# Patient Record
Sex: Female | Born: 1995 | Race: White | Marital: Single | Smoking: Never smoker
Health system: Southern US, Community
[De-identification: ages and names within clinical notes are randomized; demographics above are authoritative.]

---
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
## 2015-12-02 ENCOUNTER — Encounter: Payer: Self-pay | Admitting: Obstetrics and Gynecology

## 2016-10-19 ENCOUNTER — Other Ambulatory Visit (INDEPENDENT_AMBULATORY_CARE_PROVIDER_SITE_OTHER): Payer: Self-pay | Admitting: Otolaryngology

## 2016-10-19 DIAGNOSIS — J320 Chronic maxillary sinusitis: Secondary | ICD-10-CM

## 2016-11-11 ENCOUNTER — Ambulatory Visit
Admission: RE | Admit: 2016-11-11 | Discharge: 2016-11-11 | Disposition: A | Discharge status: Home / Self Care | Payer: 59 | Source: Ambulatory Visit | Attending: Otolaryngology | Admitting: Otolaryngology

## 2016-11-11 ENCOUNTER — Other Ambulatory Visit: Payer: Self-pay

## 2016-11-11 DIAGNOSIS — J320 Chronic maxillary sinusitis: Secondary | ICD-10-CM

## 2017-09-17 ENCOUNTER — Emergency Department (HOSPITAL_COMMUNITY)
Admission: EM | Admit: 2017-09-17 | Discharge: 2017-09-17 | Disposition: A | Discharge status: Home / Self Care | Payer: 59 | Attending: Emergency Medicine | Admitting: Emergency Medicine

## 2017-09-17 ENCOUNTER — Encounter (HOSPITAL_COMMUNITY): Payer: Self-pay | Admitting: Nurse Practitioner

## 2017-09-17 DIAGNOSIS — H6691 Otitis media, unspecified, right ear: Secondary | ICD-10-CM | POA: Insufficient documentation

## 2017-09-17 DIAGNOSIS — H6690 Otitis media, unspecified, unspecified ear: Secondary | ICD-10-CM

## 2017-09-17 DIAGNOSIS — Z79899 Other long term (current) drug therapy: Secondary | ICD-10-CM | POA: Insufficient documentation

## 2017-09-17 DIAGNOSIS — H9201 Otalgia, right ear: Secondary | ICD-10-CM | POA: Diagnosis present

## 2017-09-17 MED ORDER — AMOXICILLIN 500 MG PO CAPS
500.0000 mg | ORAL_CAPSULE | Freq: Once | ORAL | Status: AC
  Administered 2017-09-17: 500 mg via ORAL
  Filled 2017-09-17: qty 1

## 2017-09-17 MED ORDER — AMOXICILLIN 500 MG PO CAPS: 500.0000 mg | ORAL_CAPSULE | Freq: Three times a day (TID) | ORAL | 0 refills | Status: DC

## 2017-09-17 NOTE — ED Provider Notes (Signed)
[facility] Provider Note   CSN: [id number] Arrival date & time: 09/17/17  0105     History   Chief Complaint Chief Complaint  Patient presents with  . Otalgia    Right     HPI Joya GaskinsCatherine Galas is a 21 y.o. female.  Patient presents to the emergency department with a chief complaint of earache.  She states that she has had sinus congestion and rhinorrhea for the past week.  She reports that she began having sudden onset right ear pain tonight.  She also reports some left ear pain.  She endorses some decreased hearing in the right ear.  She denies any fevers or chills.  Denies any other associated symptoms.  Denies any modifying factors.   The history is provided by the patient. No language interpreter was used.    History reviewed. No pertinent past medical history.  There are no active problems to display for this patient.   History reviewed. No pertinent surgical history.  OB History    No data available       Home Medications    Prior to Admission medications   Medication Sig Start Date End Date Taking? Authorizing Provider  amoxicillin (AMOXIL) 500 MG capsule Take 1 capsule (500 mg total) by mouth 3 (three) times daily. 09/17/17   Roxy HorsemanBrowning, Cass Edinger, PA-C  cephALEXin (KEFLEX) 500 MG capsule Take 500 mg by mouth 2 (two) times daily.    [provider]  HYDROcodone-acetaminophen (NORCO/VICODIN) 5-325 MG per tablet Take 1-2 tablets by mouth every 6 (six) hours as needed for pain. 06/01/13   Cathren LaineSteinl, Kevin, MD  loratadine (CLARITIN) 10 MG tablet Take 10 mg by mouth daily as needed for allergies.    [provider]  Norgestimate-Ethinyl Estradiol Triphasic (ORTHO TRI-CYCLEN, 28,) 0.18/0.215/0.25 MG-35 MCG tablet Take 1 tablet by mouth every evening.    [provider]    Family History No family history on file.  Social History Social History  Substance Use Topics  . Smoking status: Never Smoker  .  Smokeless tobacco: Never Used  . Alcohol use Yes     Comment: socially      Allergies   Patient has no known allergies.   Review of Systems Review of Systems  All other systems reviewed and are negative.    Physical Exam Updated Vital Signs BP 132/84 (BP Location: Left Arm)   Pulse 89   Temp 97.9 F (36.6 C) (Oral)   Resp 18   Ht 5\' 4"  (1.626 m)   Wt 90.7 kg (200 lb)   SpO2 99%   BMI 34.33 kg/m   Physical Exam  Constitutional: She is oriented to person, place, and time. She appears well-developed and well-nourished.  HENT:  Head: Normocephalic and atraumatic.  Right TM is erythematous with congestion, normal canal Left TM is erythematous without congestion, normal canal  Eyes: Conjunctivae and EOM are normal.  Neck: Normal range of motion.  Cardiovascular: Normal rate, regular rhythm and normal heart sounds.   Pulmonary/Chest: Breath sounds normal. No respiratory distress. She has no wheezes.  Abdominal: She exhibits no distension.  Musculoskeletal: Normal range of motion.  Neurological: She is alert and oriented to person, place, and time.  Skin: Skin is dry.  Psychiatric: She has a normal mood and affect. Her behavior is normal. Judgment and thought content normal.  Nursing note and vitals reviewed.    ED Treatments / Results  Labs (all labs ordered are listed, but only abnormal results are  displayed) Labs Reviewed - No data to display  EKG  EKG Interpretation None       Radiology No results found.  Procedures Procedures (including critical care time)  Medications Ordered in ED Medications  amoxicillin (AMOXIL) capsule 500 mg (not administered)     Initial Impression / Assessment and Plan / ED Course  I have reviewed the triage vital signs and the nursing notes.  Pertinent labs & imaging results that were available during my care of the patient were reviewed by me and considered in my medical decision making (see chart for details).      This patient with otitis media.  Will treat with amoxicillin.  PCP follow-up.  Final Clinical Impressions(s) / ED Diagnoses   Final diagnoses:  Acute otitis media, unspecified otitis media type    New Prescriptions New Prescriptions   AMOXICILLIN (AMOXIL) 500 MG CAPSULE    Take 1 capsule (500 mg total) by mouth 3 (three) times daily.     Roxy Horseman, PA-C 09/17/17 0158    Paula Libra, MD 09/17/17 [phone]

## 2017-09-17 NOTE — ED Triage Notes (Signed)
Patient reports she woke up this morning with severe right ear pain. Denies any recent injuries or objects placed in the ear. Reports taking ibuprofen around midnight and attempted to lay back down but the pain became more severe and felt like someone was stabbing in her ear. Patient is ambulatory and can talk in complete sentences. Denies any fever or redness to ear site.

## 2018-06-05 IMAGING — CT CT MAXILLOFACIAL W/O CM
3 series · 15 of 47 positions shown, 18 images · non-contrast
Comparison: None.

CLINICAL DATA: Chronic maxillary sinusitis.

EXAM:
CT MAXILLOFACIAL WITHOUT CONTRAST
TECHNIQUE: Multidetector CT imaging of the maxillofacial structures was
performed. Multiplanar CT image reconstructions were also generated.
A small metallic BB was placed on the right temple in order to
reliably differentiate right from left.

[Series 3: soft tissue · axial · 0.48mm/px · z∈[-371,-219]mm · 9 of 178 slices shown, 12 images]
[im 13/178  brain]
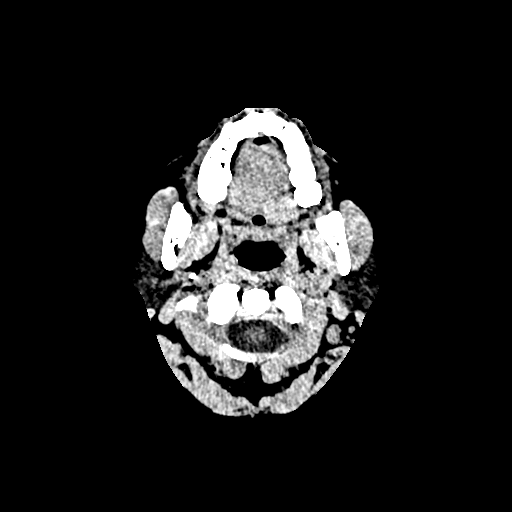
[im 13/178  bone]
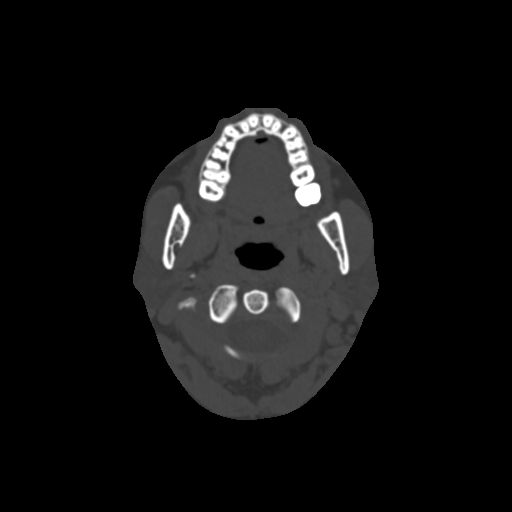
[im 31/178  bone]
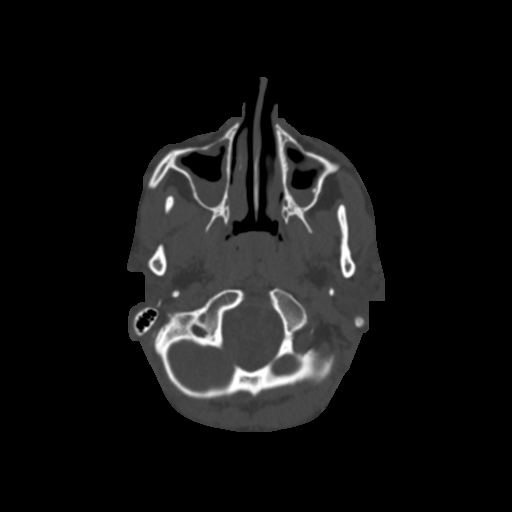
[im 49/178  bone]
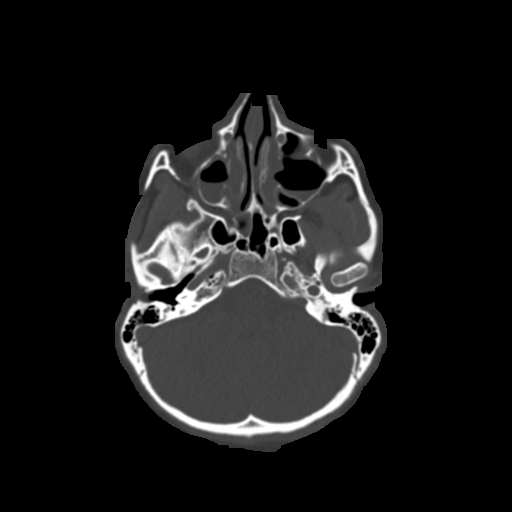
[im 68/178  bone]
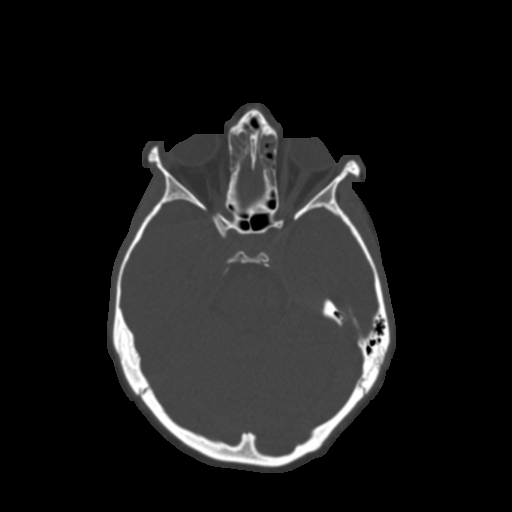
[im 92/178  brain]
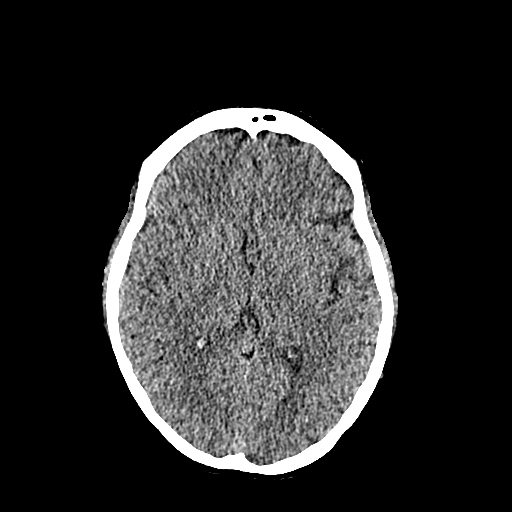
[im 92/178  bone]
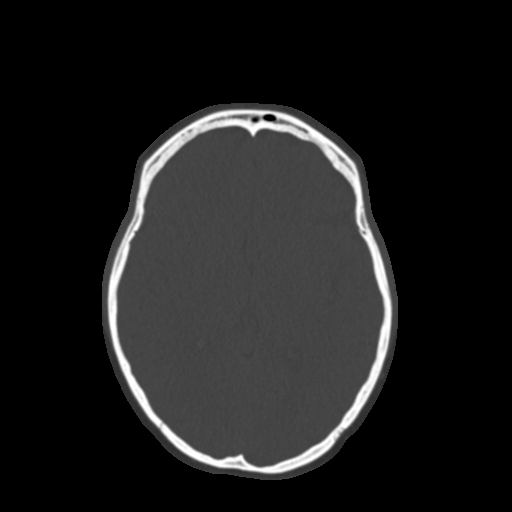
[im 110/178  bone]
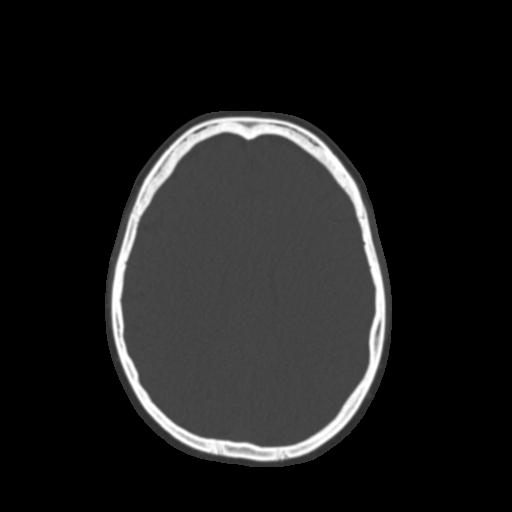
[im 129/178  bone]
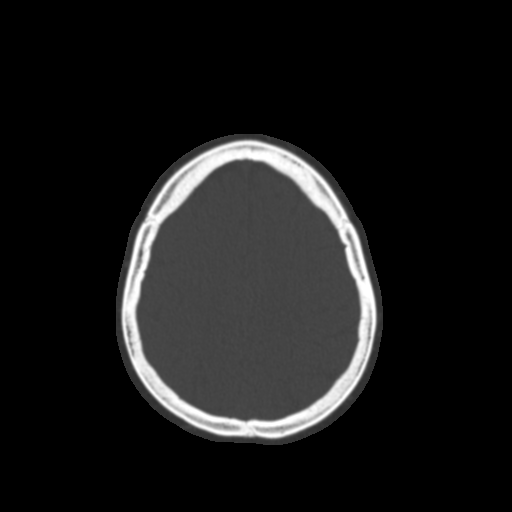
[im 147/178  bone]
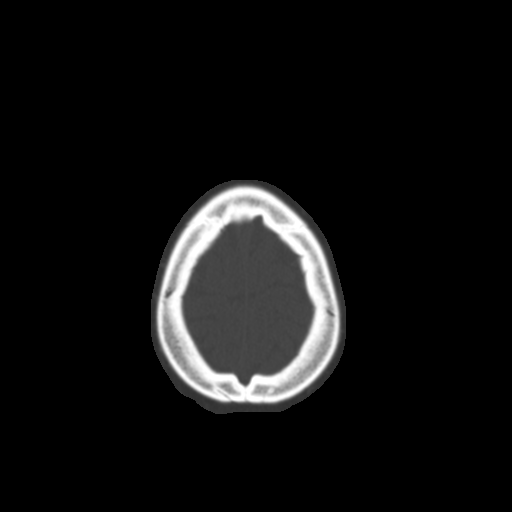
[im 165/178  brain]
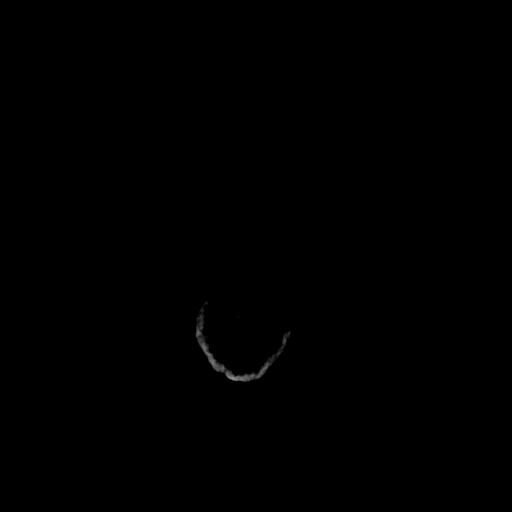
[im 165/178  bone]
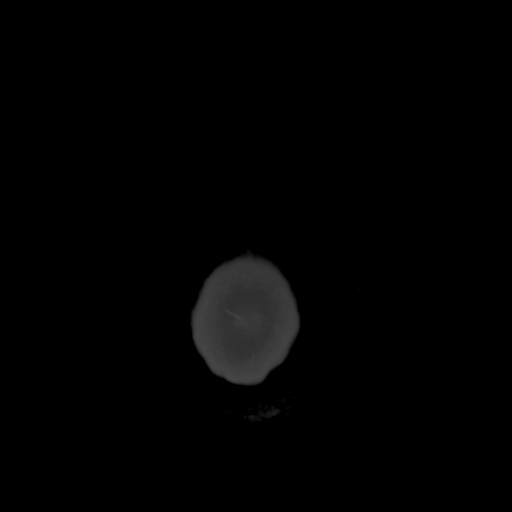

[Series 6: cor soft · coronal · 0.33mm/px · 3 of 105 slices shown]
[im 35/105  bone]
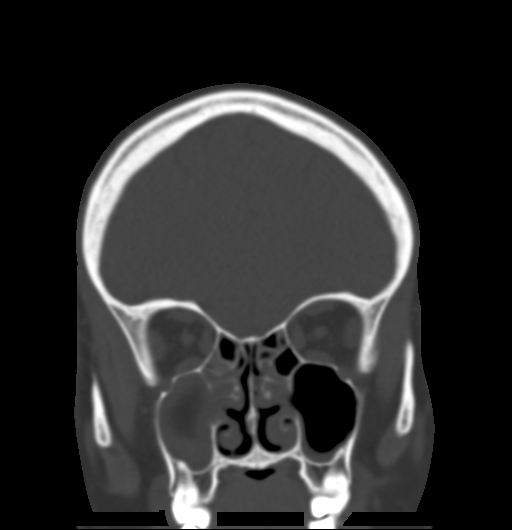
[im 47/105  bone]
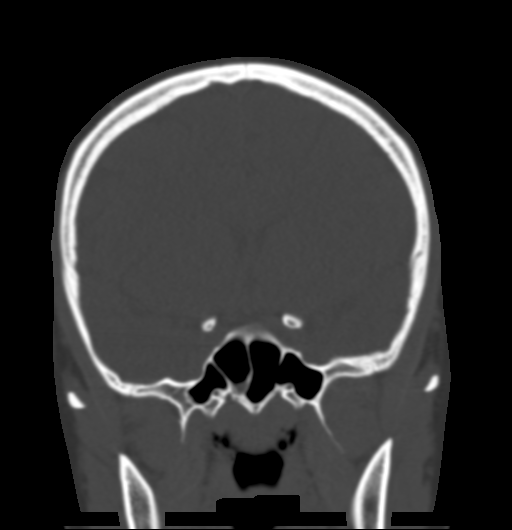
[im 58/105  bone]
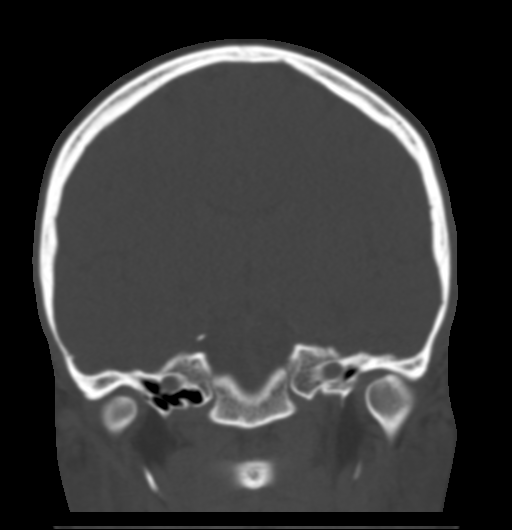

[Series 7: sag soft · sagittal · 0.33mm/px · 3 of 78 slices shown]
[im 26/78  bone]
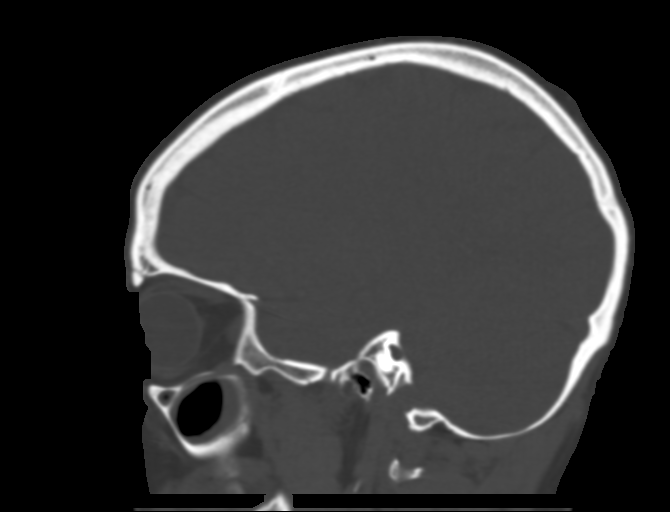
[im 39/78  bone]
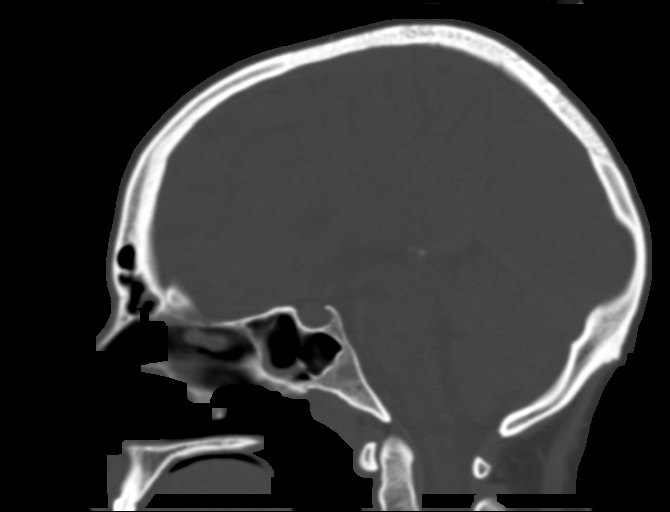
[im 52/78  bone]
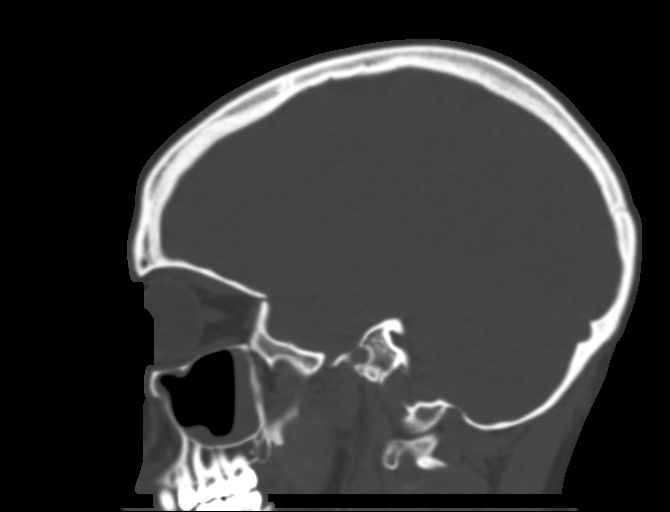

[15 of 47 positions shown; findings below may reference images not displayed]

FINDINGS: Osseous: No fracture destructive process.

Orbits: Negative

Sinuses: There is advanced diffuse mucosal thickening, effacing most
anterior ethmoid air cells.

Bilateral middle turbinates are effaced by mucosal thickening, as
are the maxillary infundibula. Fluid levels later in the bilateral
maxillary sinuses.

Mucosal thickening completely effaces bilateral frontal ethmoidal
recesses. The right frontal sinus is hypoplastic.

Both sphenoid sinus ostia are effaced by mucosal thickening, without
active sphenoid sinusitis.

Midline nasal septum with small rightward spur not deforming the
inferior turbinate. Symmetric olfactory recess depth. Intact medial
orbital walls.

Soft tissues: No soft tissue inflammation. Adenoid thickening with
nasopharyngeal narrowing.

Limited intracranial: Negative
IMPRESSION: 1. Generalized sinusitis and sinus obstruction from mucosal
thickening. Bilateral maxillary fluid levels.
2. Midline nasal septum with small spur.
3. Hypertrophic adenoid.

## 2018-08-30 ENCOUNTER — Telehealth: Payer: 59 | Admitting: Family

## 2018-08-30 DIAGNOSIS — H60339 Swimmer's ear, unspecified ear: Secondary | ICD-10-CM

## 2018-08-30 NOTE — Progress Notes (Signed)
Thank you for the details you included in the comment boxes. Those details are very helpful in determining the best course of treatment for you and help [place] to provide the best care.  E Visit for Swimmer's Ear  We are sorry that you are not feeling well. Here is how we plan to help!  I have prescribed: Ciprofloxin 0.2% and hydrocortisone 1% otic suspension 3 drops in affected ears twice daily until completed  Cipro 500mg , one tablet my mouth twice a day for 10 days  In certain cases swimmer's ear may progress to a more serious bacterial infection of the middle or inner ear.  If you have a fever 102 and up and significantly worsening symptoms, this could indicate a more serious infection moving to the middle/inner and needs face to face evaluation in an office by a provider.  Your symptoms should improve over the next 3 days and should resolve in about 7 days.  HOME CARE:   Wash your hands frequently.  Do not place the tip of the bottle on your ear or touch it with your fingers.  You can take Acetominophen 650 mg every 4-6 hours as needed for pain.  If pain is severe or moderate, you can apply a heating pad (set on low) or hot water bottle (wrapped in a towel) to outer ear for 20 minutes.  This will also increase drainage.  Avoid ear plugs  Do not use Q-tips  After showers, help the water run out by tilting your head to one side.  GET HELP RIGHT AWAY IF:   Fever is over 102.2 degrees.  You develop progressive ear pain or hearing loss.  Ear symptoms persist longer than 3 days after treatment.  MAKE SURE YOU:   Understand these instructions.  Will watch your condition.  Will get help right away if you are not doing well or get worse.  TO PREVENT SWIMMER'S EAR:  Use a bathing cap or custom fitted swim molds to keep your ears dry.  Towel off after swimming to dry your ears.  Tilt your head or pull your earlobes to allow the water to escape your ear canal.  If there is  still water in your ears, consider using a hairdryer on the lowest setting.  Thank you for choosing an e-visit. Your e-visit answers were reviewed by a board certified advanced clinical practitioner to complete your personal care plan. Depending upon the condition, your plan could have included both over the counter or prescription medications. Please review your pharmacy choice. Be sure that the pharmacy you have chosen is open so that you can pick up your prescription now.  If there is a problem you may message your provider in MyChart to have the prescription routed to another pharmacy. Your safety is important to [place]. If you have drug allergies check your prescription carefully.  For the next 24 hours, you can use MyChart to ask questions about today's visit, request a non-urgent call back, or ask for a work or school excuse from your e-visit provider. You will get an email in the next two days asking about your experience. I hope that your e-visit has been valuable and will speed your recovery.

## 2019-01-04 ENCOUNTER — Telehealth: Payer: 59 | Admitting: Family

## 2019-01-04 DIAGNOSIS — J019 Acute sinusitis, unspecified: Secondary | ICD-10-CM | POA: Diagnosis not present

## 2019-01-04 DIAGNOSIS — H65193 Other acute nonsuppurative otitis media, bilateral: Secondary | ICD-10-CM

## 2019-01-04 MED ORDER — AMOXICILLIN-POT CLAVULANATE 875-125 MG PO TABS: 1.0000 | ORAL_TABLET | Freq: Two times a day (BID) | ORAL | 0 refills | Status: DC

## 2019-01-04 NOTE — Progress Notes (Signed)
We are sorry that you are not feeling well.  Here is how we plan to help!  Based on what you have shared with me it looks like you have sinusitis.  Sinusitis is inflammation and infection in the sinus cavities of the head.  Based on your presentation I believe you most likely have Acute Bacterial Sinusitis.  This is an infection caused by bacteria and is treated with antibiotics. I have prescribed Augmentin 875mg /125mg  one tablet twice daily with food, for 7 days. You may use an oral decongestant such as Mucinex D or if you have glaucoma or high blood pressure use plain Mucinex. Saline nasal spray help and can safely be used as often as needed for congestion.  If you develop worsening sinus pain, fever or notice severe headache and vision changes, or if symptoms are not better after completion of antibiotic, please schedule an appointment with a health care provider.    Sinus infections are not as easily transmitted as other respiratory infection, however we still recommend that you avoid close contact with loved ones, especially the very young and elderly.  Remember to wash your hands thoroughly throughout the day as this is the number one way to prevent the spread of infection!  Approx 5 mins was spent reviewing patient's medical record and documenting.   Home Care:  Only take medications as instructed by your medical team.  Complete the entire course of an antibiotic.  Do not take these medications with alcohol.  A steam or ultrasonic humidifier can help congestion.  You can place a towel over your head and breathe in the steam from hot water coming from a faucet.  Avoid close contacts especially the very young and the elderly.  Cover your mouth when you cough or sneeze.  Always remember to wash your hands.  Get Help Right Away If:  You develop worsening fever or sinus pain.  You develop a severe head ache or visual changes.  Your symptoms persist after you have completed your  treatment plan.  Make sure you  Understand these instructions.  Will watch your condition.  Will get help right away if you are not doing well or get worse.  Your e-visit answers were reviewed by a board certified advanced clinical practitioner to complete your personal care plan.  Depending on the condition, your plan could have included both over the counter or prescription medications.  If there is a problem please reply  once you have received a response from your provider.  Your safety is important to [place].  If you have drug allergies check your prescription carefully.    You can use MyChart to ask questions about today's visit, request a non-urgent call back, or ask for a work or school excuse for 24 hours related to this e-Visit. If it has been greater than 24 hours you will need to follow up with your provider, or enter a new e-Visit to address those concerns.  You will get an e-mail in the next two days asking about your experience.  I hope that your e-visit has been valuable and will speed your recovery. Thank you for using e-visits.

## 2019-01-21 ENCOUNTER — Telehealth: Payer: 59 | Admitting: Family

## 2019-01-21 DIAGNOSIS — J019 Acute sinusitis, unspecified: Secondary | ICD-10-CM | POA: Diagnosis not present

## 2019-01-21 DIAGNOSIS — B9789 Other viral agents as the cause of diseases classified elsewhere: Secondary | ICD-10-CM | POA: Diagnosis not present

## 2019-01-21 MED ORDER — FLUTICASONE PROPIONATE 50 MCG/ACT NA SUSP: 2.0000 | Freq: Every day | NASAL | 6 refills | Status: AC

## 2019-01-21 NOTE — Progress Notes (Signed)
We are sorry that you are not feeling well.  Here is how we plan to help!  Based on what you have shared with me it looks like you have sinusitis.  Sinusitis is inflammation and infection in the sinus cavities of the head.  Based on your presentation I believe you most likely have Acute Viral Sinusitis.This is an infection most likely caused by a virus. There is not specific treatment for viral sinusitis other than to help you with the symptoms until the infection runs its course.  You may use an oral decongestant such as Mucinex D or if you have glaucoma or high blood pressure use plain Mucinex. Saline nasal spray help and can safely be used as often as needed for congestion, I have prescribed: Fluticasone nasal spray two sprays in each nostril once a day. If you continue to have problems, a face to face visit is recommended considering you had a bacterial sinusitis treated 3 weeks ago.  Some authorities believe that zinc sprays or the use of Echinacea may shorten the course of your symptoms.  Sinus infections are not as easily transmitted as other respiratory infection, however we still recommend that you avoid close contact with loved ones, especially the very young and elderly.  Remember to wash your hands thoroughly throughout the day as this is the number one way to prevent the spread of infection!  Home Care:  Only take medications as instructed by your medical team.  Do not take these medications with alcohol.  A steam or ultrasonic humidifier can help congestion.  You can place a towel over your head and breathe in the steam from hot water coming from a faucet.  Avoid close contacts especially the very young and the elderly.  Cover your mouth when you cough or sneeze.  Always remember to wash your hands.  Get Help Right Away If:  You develop worsening fever or sinus pain.  You develop a severe head ache or visual changes.  Your symptoms persist after you have completed your  treatment plan.  Make sure you  Understand these instructions.  Will watch your condition.  Will get help right away if you are not doing well or get worse.  Your e-visit answers were reviewed by a board certified advanced clinical practitioner to complete your personal care plan.  Depending on the condition, your plan could have included both over the counter or prescription medications.  If there is a problem please reply  once you have received a response from your provider.  Your safety is important to [place].  If you have drug allergies check your prescription carefully.    You can use MyChart to ask questions about today's visit, request a non-urgent call back, or ask for a work or school excuse for 24 hours related to this e-Visit. If it has been greater than 24 hours you will need to follow up with your provider, or enter a new e-Visit to address those concerns.  You will get an e-mail in the next two days asking about your experience.  I hope that your e-visit has been valuable and will speed your recovery. Thank you for using e-visits.

## 2019-03-30 NOTE — Progress Notes (Signed)
Greater than 5 minutes, yet less than 10 minutes of time have been spent researching, coordinating, and implementing care for this patient today.  Thank you for the details you included in the comment boxes. Those details are very helpful in determining the best course of treatment for you and help us to provide the best care.  

## 2019-07-04 ENCOUNTER — Telehealth: Payer: 59 | Admitting: Family

## 2019-07-04 DIAGNOSIS — B9689 Other specified bacterial agents as the cause of diseases classified elsewhere: Secondary | ICD-10-CM | POA: Diagnosis not present

## 2019-07-04 DIAGNOSIS — J019 Acute sinusitis, unspecified: Secondary | ICD-10-CM | POA: Diagnosis not present

## 2019-07-04 MED ORDER — AMOXICILLIN-POT CLAVULANATE 875-125 MG PO TABS: 1.0000 | ORAL_TABLET | Freq: Two times a day (BID) | ORAL | 0 refills | Status: DC

## 2019-07-04 NOTE — Progress Notes (Signed)

## 2019-11-04 ENCOUNTER — Telehealth: Payer: 59 | Admitting: Physician Assistant

## 2019-11-04 DIAGNOSIS — H60501 Unspecified acute noninfective otitis externa, right ear: Secondary | ICD-10-CM

## 2019-11-04 MED ORDER — CIPRO HC 0.2-1 % OT SUSP: 3.0000 [drp] | Freq: Two times a day (BID) | OTIC | 0 refills | Status: AC

## 2019-11-04 NOTE — Progress Notes (Signed)
E Visit for Swimmer's Ear  We are sorry that you are not feeling well. Here is how we plan to help!  I have prescribed: Ciprofloxin 0.2% and hydrocortisone 1% otic suspension 3 drops in affected ears twice daily for 7 days   In certain cases swimmer's ear may progress to a more serious bacterial infection of the middle or inner ear.  If you have a fever 102 and up and significantly worsening symptoms, this could indicate a more serious infection moving to the middle/inner and needs face to face evaluation in an office by a provider.  Your symptoms should improve over the next 3 days and should resolve in about 7 days.   HOME CARE:   Wash your hands frequently.  Do not place the tip of the bottle on your ear or touch it with your fingers.  You can take Acetominophen 650 mg every 4-6 hours as needed for pain.  If pain is severe or moderate, you can apply a heating pad (set on low) or hot water bottle (wrapped in a towel) to outer ear for 20 minutes.  This will also increase drainage.  Avoid ear plugs  Do not use Q-tips  After showers, help the water run out by tilting your head to one side.  GET HELP RIGHT AWAY IF:   Fever is over 102.2 degrees.  You develop progressive ear pain or hearing loss.  Ear symptoms persist longer than 3 days after treatment.  MAKE SURE YOU:   Understand these instructions.  Will watch your condition.  Will get help right away if you are not doing well or get worse.  TO PREVENT SWIMMER'S EAR:  Use a bathing cap or custom fitted swim molds to keep your ears dry.  Towel off after swimming to dry your ears.  Tilt your head or pull your earlobes to allow the water to escape your ear canal.  If there is still water in your ears, consider using a hairdryer on the lowest setting.  Thank you for choosing an e-visit. Your e-visit answers were reviewed by a board certified advanced clinical practitioner to complete your personal care plan.  Depending upon the condition, your plan could have included both over the counter or prescription medications. Please review your pharmacy choice. Be sure that the pharmacy you have chosen is open so that you can pick up your prescription now.  If there is a problem you may message your provider in [place] to have the prescription routed to another pharmacy. Your safety is important to [place]. If you have drug allergies check your prescription carefully.  For the next 24 hours, you can use MyChart to ask questions about today's visit, request a non-urgent call back, or ask for a work or school excuse from your e-visit provider. You will get an email in the next two days asking about your experience. I hope that your e-visit has been valuable and will speed your recovery.    Greater than 5 minutes, yet less than 10 minutes of time have been spent researching, coordinating and implementing care for this patient today.

## 2020-05-14 ENCOUNTER — Telehealth: Payer: 59 | Admitting: Physician Assistant

## 2020-05-14 DIAGNOSIS — J029 Acute pharyngitis, unspecified: Secondary | ICD-10-CM | POA: Diagnosis not present

## 2020-05-14 MED ORDER — AMOXICILLIN 500 MG PO CAPS: 500.0000 mg | ORAL_CAPSULE | Freq: Two times a day (BID) | ORAL | 0 refills | Status: AC

## 2020-05-14 NOTE — Progress Notes (Signed)

## 2020-08-01 ENCOUNTER — Telehealth: Payer: 59 | Admitting: Nurse Practitioner

## 2020-08-01 DIAGNOSIS — B9689 Other specified bacterial agents as the cause of diseases classified elsewhere: Secondary | ICD-10-CM | POA: Diagnosis not present

## 2020-08-01 DIAGNOSIS — N760 Acute vaginitis: Secondary | ICD-10-CM | POA: Diagnosis not present

## 2020-08-01 MED ORDER — METRONIDAZOLE 500 MG PO TABS: 500.0000 mg | ORAL_TABLET | Freq: Two times a day (BID) | ORAL | 0 refills | Status: DC

## 2020-08-01 NOTE — Progress Notes (Signed)

## 2020-08-26 ENCOUNTER — Telehealth: Payer: 59 | Admitting: Family

## 2020-08-26 DIAGNOSIS — J029 Acute pharyngitis, unspecified: Secondary | ICD-10-CM | POA: Diagnosis not present

## 2020-08-26 MED ORDER — AMOXICILLIN 500 MG PO CAPS: 500.0000 mg | ORAL_CAPSULE | Freq: Two times a day (BID) | ORAL | 0 refills | Status: AC

## 2020-08-26 NOTE — Progress Notes (Signed)

## 2020-11-04 ENCOUNTER — Telehealth: Payer: 59 | Admitting: Nurse Practitioner

## 2020-11-04 DIAGNOSIS — H6502 Acute serous otitis media, left ear: Secondary | ICD-10-CM

## 2020-11-04 MED ORDER — AMOXICILLIN-POT CLAVULANATE 875-125 MG PO TABS: 1.0000 | ORAL_TABLET | Freq: Two times a day (BID) | ORAL | 0 refills | Status: DC

## 2020-11-04 MED ORDER — NEOMYCIN-POLYMYXIN-HC 3.5-10000-1 OT SOLN: 4.0000 [drp] | Freq: Four times a day (QID) | OTIC | 0 refills | Status: DC

## 2020-11-04 NOTE — Progress Notes (Signed)
E Visit for Swimmer's Ear  We are sorry that you are not feeling well. Here is how we plan to help!  I have prescribed: Neomycin 0.35%, polymyxin B 10,000 units/mL, and hydrocortisone 0,5% otic solution 4 drops in affected ears four times a day for 7 days  Augmentin 625mg  one tablet by mouth twice a day for 10 days  In certain cases swimmer's ear may progress to a more serious bacterial infection of the middle or inner ear.  If you have a fever 102 and up and significantly worsening symptoms, this could indicate a more serious infection moving to the middle/inner and needs face to face evaluation in an office by a provider.  Your symptoms should improve over the next 3 days and should resolve in about 7 days.  HOME CARE:   Wash your hands frequently.  Do not place the tip of the bottle on your ear or touch it with your fingers.  You can take Acetominophen 650 mg every 4-6 hours as needed for pain.  If pain is severe or moderate, you can apply a heating pad (set on low) or hot water bottle (wrapped in a towel) to outer ear for 20 minutes.  This will also increase drainage.  Avoid ear plugs  Do not use Q-tips  After showers, help the water run out by tilting your head to one side.  GET HELP RIGHT AWAY IF:   Fever is over 102.2 degrees.  You develop progressive ear pain or hearing loss.  Ear symptoms persist longer than 3 days after treatment.  MAKE SURE YOU:   Understand these instructions.  Will watch your condition.  Will get help right away if you are not doing well or get worse.  TO PREVENT SWIMMER'S EAR:  Use a bathing cap or custom fitted swim molds to keep your ears dry.  Towel off after swimming to dry your ears.  Tilt your head or pull your earlobes to allow the water to escape your ear canal.  If there is still water in your ears, consider using a hairdryer on the lowest setting.  Thank you for choosing an e-visit. Your e-visit answers were reviewed by  a board certified advanced clinical practitioner to complete your personal care plan. Depending upon the condition, your plan could have included both over the counter or prescription medications. Please review your pharmacy choice. Be sure that the pharmacy you have chosen is open so that you can pick up your prescription now.  If there is a problem you may message your provider in MyChart to have the prescription routed to another pharmacy. Your safety is important to . If you have drug allergies check your prescription carefully.  For the next 24 hours, you can use MyChart to ask questions about today's visit, request a non-urgent call back, or ask for a work or school excuse from your e-visit provider. You will get an email in the next two days asking about your experience. I hope that your e-visit has been valuable and will speed your recovery.

## 2020-11-05 NOTE — Progress Notes (Signed)
5-10 minutes spent reviewing and documenting in chart.  

## 2021-04-14 ENCOUNTER — Telehealth: Payer: 59 | Admitting: Family

## 2021-04-14 DIAGNOSIS — J029 Acute pharyngitis, unspecified: Secondary | ICD-10-CM | POA: Diagnosis not present

## 2021-04-14 MED ORDER — AMOXICILLIN 500 MG PO CAPS: 500.0000 mg | ORAL_CAPSULE | Freq: Two times a day (BID) | ORAL | 0 refills | Status: AC

## 2021-04-14 NOTE — Progress Notes (Signed)

## 2021-08-02 ENCOUNTER — Telehealth: Payer: Self-pay | Admitting: Physician Assistant

## 2021-08-02 DIAGNOSIS — H109 Unspecified conjunctivitis: Secondary | ICD-10-CM

## 2021-08-02 MED ORDER — POLYMYXIN B-TRIMETHOPRIM 10000-0.1 UNIT/ML-% OP SOLN
1.0000 [drp] | OPHTHALMIC | 0 refills | Status: DC
Start: 2021-08-02 — End: 2024-08-23

## 2021-08-02 NOTE — Progress Notes (Signed)

## 2021-10-08 ENCOUNTER — Telehealth: Payer: Self-pay | Admitting: Physician Assistant

## 2021-10-08 DIAGNOSIS — J020 Streptococcal pharyngitis: Secondary | ICD-10-CM

## 2021-10-08 MED ORDER — AMOXICILLIN 500 MG PO CAPS: 500.0000 mg | ORAL_CAPSULE | Freq: Two times a day (BID) | ORAL | 0 refills | Status: AC

## 2021-10-08 NOTE — Progress Notes (Signed)

## 2022-02-25 ENCOUNTER — Telehealth: Payer: Self-pay | Admitting: Emergency Medicine

## 2022-02-25 DIAGNOSIS — J029 Acute pharyngitis, unspecified: Secondary | ICD-10-CM

## 2022-02-25 MED ORDER — AMOXICILLIN 500 MG PO CAPS: 500.0000 mg | ORAL_CAPSULE | Freq: Two times a day (BID) | ORAL | 0 refills | Status: AC

## 2022-02-25 NOTE — Progress Notes (Signed)
E-Visit for Sore Throat - Strep Symptoms ? ?We are sorry that you are not feeling well.  Here is how we plan to help! ? ?Based on what you have shared with me it is likely that you have strep pharyngitis.  Strep pharyngitis is inflammation and infection in the back of the throat.  This is an infection cause by bacteria and is treated with antibiotics.  I have prescribed Amoxicillin 500 mg twice a day for 10 days. For throat pain, we recommend over the counter oral pain relief medications such as acetaminophen or aspirin, or anti-inflammatory medications such as ibuprofen or naproxen sodium. Topical treatments such as oral throat lozenges or sprays may be used as needed. Strep infections are not as easily transmitted as other respiratory infections, however we still recommend that you avoid close contact with loved ones, especially the very young and elderly.  Remember to wash your hands thoroughly throughout the day as this is the number one way to prevent the spread of infection and wipe down door knobs and counters with disinfectant. ? ?Since you also have drainage, please keep taking Mucinex.  ? ?Home Care: ?Only take medications as instructed by your medical team. ?Complete the entire course of an antibiotic. ?Do not take these medications with alcohol. ?A steam or ultrasonic humidifier can help congestion.  You can place a towel over your head and breathe in the steam from hot water coming from a faucet. ?Avoid close contacts especially the very young and the elderly. ?Cover your mouth when you cough or sneeze. ?Always remember to wash your hands. ? ?Get Help Right Away If: ?You develop worsening fever or sinus pain. ?You develop a severe head ache or visual changes. ?Your symptoms persist after you have completed your treatment plan. ? ?Make sure you ?Understand these instructions. ?Will watch your condition. ?Will get help right away if you are not doing well or get worse. ? ? ?Thank you for choosing an  e-visit. ? ?Your e-visit answers were reviewed by a board certified advanced clinical practitioner to complete your personal care plan. Depending upon the condition, your plan could have included both over the counter or prescription medications. ? ?Please review your pharmacy choice. Make sure the pharmacy is open so you can pick up prescription now. If there is a problem, you may contact your provider through [employer] and have the prescription routed to another pharmacy.  Your safety is important to [place]. If you have drug allergies check your prescription carefully.  ? ?For the next 24 hours you can use MyChart to ask questions about today's visit, request a non-urgent call back, or ask for a work or school excuse. ?You will get an email in the next two days asking about your experience. I hope that your e-visit has been valuable and will speed your recovery.. ? ?I have spent 5 minutes in review of e-visit questionnaire, review and updating patient chart, medical decision making and response to patient.  ? ?Rica Mast, PhD, FNP-BC ?  ?

## 2022-04-05 ENCOUNTER — Telehealth: Payer: Self-pay | Admitting: Physician Assistant

## 2022-04-05 DIAGNOSIS — J0110 Acute frontal sinusitis, unspecified: Secondary | ICD-10-CM

## 2022-04-05 MED ORDER — AZITHROMYCIN 250 MG PO TABS: ORAL_TABLET | ORAL | 0 refills | Status: AC

## 2022-04-05 NOTE — Progress Notes (Signed)
E-Visit for Sinus Problems ? ?We are sorry that you are not feeling well.  Here is how we plan to help! ? ?Based on what you have shared with me it looks like you have sinusitis.  Sinusitis is inflammation and infection in the sinus cavities of the head. This very well could be a viral sinusitis so follow the supportive measures listed below. Giving your upcoming trip I will go ahead and sent in an antibiotic. I have prescribed Azithromycin to take as directed.  You may use an oral decongestant such as Mucinex D or if you have glaucoma or high blood pressure use plain Mucinex. Saline nasal spray help and can safely be used as often as needed for congestion.  If you develop worsening sinus pain, fever or notice severe headache and vision changes, or if symptoms are not better after completion of antibiotic, please schedule an appointment with a health care provider.   ? ?Sinus infections are not as easily transmitted as other respiratory infection, however we still recommend that you avoid close contact with loved ones, especially the very young and elderly.  Remember to wash your hands thoroughly throughout the day as this is the number one way to prevent the spread of infection! ? ?Home Care: ?Only take medications as instructed by your medical team. ?Complete the entire course of an antibiotic. ?Do not take these medications with alcohol. ?A steam or ultrasonic humidifier can help congestion.  You can place a towel over your head and breathe in the steam from hot water coming from a faucet. ?Avoid close contacts especially the very young and the elderly. ?Cover your mouth when you cough or sneeze. ?Always remember to wash your hands. ? ?Get Help Right Away If: ?You develop worsening fever or sinus pain. ?You develop a severe head ache or visual changes. ?Your symptoms persist after you have completed your treatment plan. ? ?Make sure you ?Understand these instructions. ?Will watch your condition. ?Will get help  right away if you are not doing well or get worse. ? ?Thank you for choosing an e-visit. ? ?Your e-visit answers were reviewed by a board certified advanced clinical practitioner to complete your personal care plan. Depending upon the condition, your plan could have included both over the counter or prescription medications. ? ?Please review your pharmacy choice. Make sure the pharmacy is open so you can pick up prescription now. If there is a problem, you may contact your provider through [employer] and have the prescription routed to another pharmacy.  Your safety is important to [place]. If you have drug allergies check your prescription carefully.  ? ?For the next 24 hours you can use MyChart to ask questions about today's visit, request a non-urgent call back, or ask for a work or school excuse. ?You will get an email in the next two days asking about your experience. I hope that your e-visit has been valuable and will speed your recovery. ? ?

## 2022-04-05 NOTE — Progress Notes (Signed)
I have spent 5 minutes in review of e-visit questionnaire, review and updating patient chart, medical decision making and response to patient.   Kyren Knick Cody Ender Rorke, PA-C    

## 2022-08-05 ENCOUNTER — Telehealth: Payer: Self-pay | Admitting: Physician Assistant

## 2022-08-05 DIAGNOSIS — R21 Rash and other nonspecific skin eruption: Secondary | ICD-10-CM

## 2022-08-05 NOTE — Progress Notes (Signed)
E Visit for Rash  We are sorry that you are not feeling well. Here is how we plan to help!  Based on what you have shown me it is hard to diagnose the rash. If you have recently had any upper respiratory symptoms, it seems to almost be a viral exanthem rash. This is a rash that will present following a viral infection, most often upper respiratory. These should resolve on their own. However, since I cannot truly diagnose it as that at this time, I have also prescribed a 6 day prednisone taper in case it may be more of a contact dermatitis or allergic response to something.   Prednisone 10 mg daily for 6 days (see taper instructions below)  Directions for 6 day taper: Day 1: 2 tablets before breakfast, 1 after both lunch & dinner and 2 at bedtime Day 2: 1 tab before breakfast, 1 after both lunch & dinner and 2 at bedtime Day 3: 1 tab at each meal & 1 at bedtime Day 4: 1 tab at breakfast, 1 at lunch, 1 at bedtime Day 5: 1 tab at breakfast & 1 tab at bedtime Day 6: 1 tab at breakfast   HOME CARE:  Take cool showers and avoid direct sunlight. Apply cool compress or wet dressings. Take a bath in an oatmeal bath.  Sprinkle content of one Aveeno packet under running faucet with comfortably warm water.  Bathe for 15-20 minutes, 1-2 times daily.  Pat dry with a towel. Do not rub the rash. Use hydrocortisone cream. Take an antihistamine like Benadryl for widespread rashes that itch.  The adult dose of Benadryl is 25-50 mg by mouth 4 times daily. Caution:  This type of medication may cause sleepiness.  Do not drink alcohol, drive, or operate dangerous machinery while taking antihistamines.  Do not take these medications if you have prostate enlargement.  Read package instructions thoroughly on all medications that you take.  GET HELP RIGHT AWAY IF:  Symptoms don't go away after treatment. Severe itching that persists. If you rash spreads or swells. If you rash begins to smell. If it blisters and  opens or develops a yellow-brown crust. You develop a fever. You have a sore throat. You become short of breath.  MAKE SURE YOU:  Understand these instructions. Will watch your condition. Will get help right away if you are not doing well or get worse.  Thank you for choosing an e-visit.  Your e-visit answers were reviewed by a board certified advanced clinical practitioner to complete your personal care plan. Depending upon the condition, your plan could have included both over the counter or prescription medications.  Please review your pharmacy choice. Make sure the pharmacy is open so you can pick up prescription now. If there is a problem, you may contact your provider through [employer] and have the prescription routed to another pharmacy.  Your safety is important to [place]. If you have drug allergies check your prescription carefully.   For the next 24 hours you can use MyChart to ask questions about today's visit, request a non-urgent call back, or ask for a work or school excuse. You will get an email in the next two days asking about your experience. I hope that your e-visit has been valuable and will speed your recovery.  I provided 5 minutes of non face-to-face time during this encounter for chart review and documentation.

## 2024-04-11 ENCOUNTER — Telehealth: Admitting: Physician Assistant

## 2024-04-11 DIAGNOSIS — N760 Acute vaginitis: Secondary | ICD-10-CM

## 2024-04-11 DIAGNOSIS — R3989 Other symptoms and signs involving the genitourinary system: Secondary | ICD-10-CM

## 2024-04-11 NOTE — Progress Notes (Signed)
  Because you have symptoms of a UTI but also concern for vaginitis giving thick vaginal discharge, that needs further workup, I feel your condition warrants further evaluation and I recommend that you be seen in a face-to-face visit.   NOTE: There will be NO CHARGE for this E-Visit   If you are having a true medical emergency, please call 911.     For an urgent face to face visit, [facility] has multiple urgent care centers for your convenience.  Click the link below for the full list of locations and hours, walk-in wait times, appointment scheduling options and driving directions:  Urgent Care - [place], [place], [place], [place], [place], [place]  [place]     Your MyChart E-visit questionnaire answers were reviewed by a board certified advanced clinical practitioner to complete your personal care plan based on your specific symptoms.    Thank you for using e-Visits.

## 2024-08-23 ENCOUNTER — Telehealth: Admitting: Physician Assistant

## 2024-08-23 DIAGNOSIS — H6502 Acute serous otitis media, left ear: Secondary | ICD-10-CM

## 2024-08-23 MED ORDER — AMOXICILLIN-POT CLAVULANATE 875-125 MG PO TABS: 1.0000 | ORAL_TABLET | Freq: Two times a day (BID) | ORAL | 0 refills | Status: AC

## 2024-08-23 NOTE — Progress Notes (Signed)
